# Patient Record
Sex: Female | Born: 1960 | Race: White | Hispanic: No | Marital: Single | Smoking: Never smoker
Health system: Southern US, Community
[De-identification: ages and names within clinical notes are randomized; demographics above are authoritative.]

## PROBLEM LIST (undated history)

## (undated) HISTORY — PX: ABDOMINAL HYSTERECTOMY: SHX81

---

## 2009-08-06 ENCOUNTER — Encounter: Admission: RE | Admit: 2009-08-06 | Discharge: 2009-08-06 | Payer: Self-pay | Admitting: Family Medicine

## 2009-08-06 ENCOUNTER — Ambulatory Visit: Payer: Self-pay | Admitting: Family Medicine

## 2009-08-06 DIAGNOSIS — R5383 Other fatigue: Secondary | ICD-10-CM

## 2009-08-06 DIAGNOSIS — R5381 Other malaise: Secondary | ICD-10-CM

## 2009-08-06 DIAGNOSIS — M25579 Pain in unspecified ankle and joints of unspecified foot: Secondary | ICD-10-CM | POA: Insufficient documentation

## 2009-08-06 DIAGNOSIS — I10 Essential (primary) hypertension: Secondary | ICD-10-CM | POA: Insufficient documentation

## 2009-08-06 DIAGNOSIS — E785 Hyperlipidemia, unspecified: Secondary | ICD-10-CM | POA: Insufficient documentation

## 2009-08-06 DIAGNOSIS — M25569 Pain in unspecified knee: Secondary | ICD-10-CM | POA: Insufficient documentation

## 2009-08-06 DIAGNOSIS — R002 Palpitations: Secondary | ICD-10-CM

## 2009-08-06 LAB — CONVERTED CEMR LAB
Alkaline Phosphatase: 58 units/L (ref 39–117)
CO2: 25 meq/L (ref 19–32)
Chloride: 105 meq/L (ref 96–112)
Creatinine, Ser: 0.65 mg/dL (ref 0.40–1.50)
Eosinophils Absolute: 0.1 10*3/uL (ref 0.0–0.7)
Eosinophils Relative: 1 % (ref 0–5)
Lymphocytes Relative: 33 % (ref 12–46)
MCHC: 34.2 g/dL (ref 30.0–36.0)
Potassium: 4.2 meq/L (ref 3.5–5.3)
RBC: 4.93 M/uL (ref 4.22–5.81)
RDW: 12.7 % (ref 11.5–15.5)
TSH: 1.139 microintl units/mL (ref 0.350–4.500)
Total Bilirubin: 0.5 mg/dL (ref 0.3–1.2)

## 2009-08-21 ENCOUNTER — Encounter: Payer: Self-pay | Admitting: Family Medicine

## 2010-04-14 NOTE — Miscellaneous (Signed)
Summary: MEDICAL RELEASE  MEDICAL RELEASE   Imported By: Shelbie Proctor 08/21/2009 18:29:30  _____________________________________________________________________  External Attachment:    Type:   Image     Comment:   External Document

## 2010-04-14 NOTE — Letter (Signed)
Summary: Out of Work  [facility]  [address]   [place], [place] [address]   Phone: [phone]  Fax: [phone]    Aug 06, 2009   Employee:  Christy Shaw    To Whom It May Concern:   For Medical reasons, please excuse the above named employee from work for the following dates:  Start:   08/06/2009  Return:   08/08/2009  If you need additional information, please feel free to contact our office.         Sincerely,    Hassan Rowan MD

## 2010-04-14 NOTE — Assessment & Plan Note (Signed)
Summary: DIZZINESS/KH   Vital Signs:  Patient Profile:   50 Years Old Female CC:      Rt ankle swelling moving up into legs x 1 week, SOB, Anxiety, lightheaded Height:     64 inches Weight:      181 pounds O2 Sat:      99 % O2 treatment:    Room Air Temp:     98.2 degrees F oral Pulse rate:   106 / minute Pulse rhythm:   regular Resp:     12 per minute BP sitting:   150 / 91  (right arm) Cuff size:   large  Vitals Entered By: Emilio Math (Aug 06, 2009 9:10 AM)                   Prior Medication List:  No prior medications documented  Current Allergies (reviewed today): ! PENICILLIN ! SULFAHistory of Present Illness Chief Complaint: Rt ankle swelling moving up into legs x 1 week, SOB, Anxiety, lightheaded History of Present Illness: Hx of bad ankle and feet. Hx of Mortons Neuroma. The R foot has been swollen and inflamed. Both lower legs are swollen.   Patient has had heart palpatations and his going through the changes. she has had increase fatigue.    Current Problems: FATIGUE, ACUTE (ICD-780.79) PALPITATIONS, RECURRENT (ICD-785.1) LEG, LOWER, PAIN (ICD-719.46) ANKLE, PAIN (ICD-719.47) EDEMA (ICD-782.3) HYPERTENSION (ICD-401.9) HYPERLIPIDEMIA (ICD-272.4)   Current Meds HYDROCHLOROTHIAZIDE 25 MG TABS (HYDROCHLOROTHIAZIDE)  SIMVASTATIN 20 MG TABS (SIMVASTATIN)  APATATE 25 MG/5ML LIQD (VITAMINS B1 B6 B12)   REVIEW OF SYSTEMS Constitutional Symptoms       Complains of weight gain and fatigue.     Denies fever, chills, night sweats, and weight loss.      Comments: 10 lbs in 1 week Eyes       Denies change in vision, eye pain, eye discharge, glasses, contact lenses, and eye surgery. Ear/Nose/Throat/Mouth       Denies hearing loss/aids, change in hearing, ear pain, ear discharge, dizziness, frequent runny nose, frequent nose bleeds, sinus problems, sore throat, hoarseness, and tooth pain or bleeding.  Respiratory       Complains of shortness of breath.       Denies dry cough, productive cough, wheezing, asthma, bronchitis, and emphysema/COPD.  Cardiovascular       Complains of tires easily with exhertion.      Denies murmurs and chest pain.      Comments: palpatations   Gastrointestinal       Denies stomach pain, nausea/vomiting, diarrhea, constipation, blood in bowel movements, and indigestion. Genitourniary       Denies painful urination, kidney stones, and loss of urinary control. Neurological       Denies paralysis, seizures, and fainting/blackouts. Musculoskeletal       Complains of muscle pain, joint pain, and swelling.      Denies joint stiffness, decreased range of motion, redness, muscle weakness, and gout.  Skin       Denies bruising, unusual mles/lumps or sores, and hair/skin or nail changes.  Psych       Complains of anxiety/stress.      Denies mood changes, temper/anger issues, speech problems, depression, and sleep problems.  Past History:  Family History: Last updated: 08/06/2009 Mother, D, Cerebral hemorrage father, D, Prostate CA Brother died of heart conditions in his 4s.  Social History: Last updated: 08/06/2009 Non smoker ETOH-Yes  No [employer] Work  Past Medical History: Hyperlipidemia Hypertension  Past Surgical History:  Hysterectomy  Family History: Reviewed history and no changes required. Mother, D, Cerebral hemorrage father, D, Prostate CA Brother died of heart conditions in his 64s.  Social History: Reviewed history and no changes required. Non smoker ETOH-Yes  No [employer] Work Physical Exam General appearance: well developed, well nourished,mild distress Head: normocephalic, atraumatic Neck: neck supple,  trachea midline, no masses Chest/Lungs: no rales, wheezes, or rhonchi bilateral, breath sounds equal without effort Heart: regular rate and  rhythm, no murmur Abdomen: soft, non-tender without obvious organomegaly Extremities: mild swelling   negative homan Neurological:  grossly intact and non-focal Skin: no obvious rashes or lesions MSE: oriented to time, place, and person Assessment Problems:   New Problems: FATIGUE, ACUTE (ICD-780.79) PALPITATIONS, RECURRENT (ICD-785.1) LEG, LOWER, PAIN (ICD-719.46) ANKLE, PAIN (ICD-719.47) EDEMA (ICD-782.3) HYPERTENSION (ICD-401.9) HYPERLIPIDEMIA (ICD-272.4)  edema of leg  leg  pain   fstigue    stress  Patient Education: Patient and/or caregiver instructed in the following: rest fluids and Tylenol.  Plan New Orders: New Patient Level IV [99204] EKG w/ Interpretation [93000] T-CBC w/Diff [[id number]] T-TSH [[id number]] T-Comprehensive Metabolic Panel [[id number]] Lower Extremity Ultrasound with Doppler [Sono LE w/Doppler] Planning Comments:   will recommend she keep her appointment w/her PCP tommorrow. They can get her records faxed  Follow Up: Follow up in 2-3 days if no improvement  The patient and/or caregiver has been counseled thoroughly with regard to medications prescribed including dosage, schedule, interactions, rationale for use, and possible side effects and they verbalize understanding.  Diagnoses and expected course of recovery discussed and will return if not improved as expected or if the condition worsens. Patient and/or caregiver verbalized understanding.   Patient Instructions: 1)  Please schedule an appointment with your primary doctor in :AM as scheduled 2)  Willl hold off on any new scrips 3)  Recommended remaining out of work for 2 days maty return on 08/08/2009.

## 2014-03-17 ENCOUNTER — Emergency Department (HOSPITAL_BASED_OUTPATIENT_CLINIC_OR_DEPARTMENT_OTHER): Payer: BC Managed Care – PPO

## 2014-03-17 ENCOUNTER — Encounter (HOSPITAL_BASED_OUTPATIENT_CLINIC_OR_DEPARTMENT_OTHER): Payer: Self-pay | Admitting: *Deleted

## 2014-03-17 ENCOUNTER — Emergency Department (HOSPITAL_BASED_OUTPATIENT_CLINIC_OR_DEPARTMENT_OTHER)
Admission: EM | Admit: 2014-03-17 | Discharge: 2014-03-17 | Disposition: A | Discharge status: Home / Self Care | Payer: BC Managed Care – PPO | Attending: Emergency Medicine | Admitting: Emergency Medicine

## 2014-03-17 DIAGNOSIS — S59912A Unspecified injury of left forearm, initial encounter: Secondary | ICD-10-CM

## 2014-03-17 DIAGNOSIS — Z880 Allergy status to penicillin: Secondary | ICD-10-CM | POA: Diagnosis not present

## 2014-03-17 DIAGNOSIS — Y998 Other external cause status: Secondary | ICD-10-CM | POA: Diagnosis not present

## 2014-03-17 DIAGNOSIS — S8011XA Contusion of right lower leg, initial encounter: Secondary | ICD-10-CM | POA: Diagnosis not present

## 2014-03-17 DIAGNOSIS — Y9289 Other specified places as the place of occurrence of the external cause: Secondary | ICD-10-CM | POA: Diagnosis not present

## 2014-03-17 DIAGNOSIS — Y9389 Activity, other specified: Secondary | ICD-10-CM | POA: Diagnosis not present

## 2014-03-17 DIAGNOSIS — Z79899 Other long term (current) drug therapy: Secondary | ICD-10-CM | POA: Diagnosis not present

## 2014-03-17 DIAGNOSIS — S5012XA Contusion of left forearm, initial encounter: Secondary | ICD-10-CM | POA: Insufficient documentation

## 2014-03-17 DIAGNOSIS — S4991XA Unspecified injury of right shoulder and upper arm, initial encounter: Secondary | ICD-10-CM | POA: Diagnosis present

## 2014-03-17 DIAGNOSIS — W19XXXA Unspecified fall, initial encounter: Secondary | ICD-10-CM

## 2014-03-17 DIAGNOSIS — W01198A Fall on same level from slipping, tripping and stumbling with subsequent striking against other object, initial encounter: Secondary | ICD-10-CM | POA: Insufficient documentation

## 2014-03-17 DIAGNOSIS — M79661 Pain in right lower leg: Secondary | ICD-10-CM

## 2014-03-17 MED ORDER — IBUPROFEN 800 MG PO TABS
800.0000 mg | ORAL_TABLET | Freq: Once | ORAL | Status: AC
  Administered 2014-03-17: 800 mg via ORAL
  Filled 2014-03-17: qty 1

## 2014-03-17 MED ORDER — HYDROCODONE-ACETAMINOPHEN 5-325 MG PO TABS: 2.0000 | ORAL_TABLET | Freq: Once | ORAL | Status: DC

## 2014-03-17 MED ORDER — HYDROCODONE-ACETAMINOPHEN 5-325 MG PO TABS: 1.0000 | ORAL_TABLET | Freq: Four times a day (QID) | ORAL | Status: AC | PRN

## 2014-03-17 NOTE — ED Provider Notes (Signed)
CSN: [id number]     Arrival date & time 03/17/14  4098 History   First MD Initiated Contact with Patient 03/17/14 0715     Chief Complaint  Patient presents with  . Fall     (Consider location/radiation/quality/duration/timing/severity/associated sxs/prior Treatment) The history is provided by the patient.  Christy Shaw is a 54 y.o. female here with fall. She was painting last night around 9 PM. She was standing on the kitchen counter and accidentally fell. She did fall backwards and hit the back of her head as well as the right shoulder and left forearm and left leg. She woke up this morning with severe pain. Denies any headache or nausea or vomiting. Complains of right shoulder pain left arm bruise and right calf bruise. Denies any neck pain.    History reviewed. No pertinent past medical history. Past Surgical History  Procedure Laterality Date  . Abdominal hysterectomy     No family history on file. History  Substance Use Topics  . Smoking status: Never Smoker   . Smokeless tobacco: Not on file  . Alcohol Use: No   OB History    No data available     Review of Systems  Musculoskeletal:       R leg, R shoulder, L forearm pain   All other systems reviewed and are negative.     Allergies  Penicillins and Sulfonamide derivatives  Home Medications   Prior to Admission medications   Medication Sig Start Date End Date Taking? Authorizing Provider  hydrochlorothiazide (HYDRODIURIL) 12.5 MG tablet Take 12.5 mg by mouth daily.   Yes Historical Provider, MD  simvastatin (ZOCOR) 10 MG tablet Take 10 mg by mouth daily.   Yes Historical Provider, MD   BP 143/69 mmHg  Pulse 71  Temp(Src) 98.3 F (36.8 C) (Oral)  Resp 20  Ht  (1.6 m)  Wt 155 lb (70.308 kg)  BMI 27.46 kg/m2  SpO2 100% Physical Exam  Constitutional: She is oriented to person, place, and time.  Slightly uncomfortable   HENT:  Head: Normocephalic and atraumatic.  Mouth/Throat: Oropharynx is clear and  moist.  No obvious scalp hematoma   Eyes: Conjunctivae are normal. Pupils are equal, round, and reactive to light.  Neck:  No midline tenderness, nl ROM neck   Cardiovascular: Normal rate, regular rhythm and normal heart sounds.   Pulmonary/Chest: Effort normal and breath sounds normal. No respiratory distress. She has no wheezes. She has no rales. She exhibits no tenderness.  Abdominal: Soft. Bowel sounds are normal. She exhibits no distension. There is no tenderness. There is no rebound and no guarding.  Musculoskeletal:  L forearm with bruise, no obvious bony tenderness. R calf with bruise with no obvious bony tenderness. Neurovascular intact. R shoulder dec ROM   Neurological: She is alert and oriented to person, place, and time. No cranial nerve deficit. Coordination normal.  Skin: Skin is warm and dry.  Psychiatric: She has a normal mood and affect. Her behavior is normal. Judgment and thought content normal.  Nursing note and vitals reviewed.   ED Course  Procedures (including critical care time) Labs Review Labs Reviewed - No data to display  Imaging Review Dg Shoulder Right  03/17/2014   CLINICAL DATA:  Fall last night.  Right superior scapular pain.  EXAM: RIGHT SHOULDER - 2+ VIEW  COMPARISON:  None.  FINDINGS: Visualized portion of the right hemithorax is normal. No acute fracture or dislocation.  IMPRESSION: Normal right shoulder.   Electronically Signed  By: Jeronimo Greaves M.D.   On: 03/17/2014 08:23   Dg Forearm Left  03/17/2014   CLINICAL DATA:  Status post fall from kitchen counter last night with left forearm pain  EXAM: LEFT FOREARM - 2 VIEW  COMPARISON:  None.  FINDINGS: There is no evidence of fracture or dislocation. Soft tissues are unremarkable.  IMPRESSION: No acute fracture dislocation.   Electronically Signed   By: Sherian Rein M.D.   On: 03/17/2014 08:26   Dg Tibia/fibula Right  03/17/2014   CLINICAL DATA:  Fall from kitchen count are last night. Abrasion in  medial right lower leg with pain.  EXAM: RIGHT TIBIA AND FIBULA - 2 VIEW  COMPARISON:  None.  FINDINGS: Small Achilles spur. No acute fracture or dislocation. No significant soft tissue swelling. No knee joint effusion.  IMPRESSION: No acute osseous abnormality.   Electronically Signed   By: Jeronimo Greaves M.D.   On: 03/17/2014 08:27     EKG Interpretation None      MDM   Final diagnoses:  Fall    Christy Shaw is a 54 y.o. female here with mechanical fall. Likely muscle injury. Will get xray to r/o fractures. No need for CT head as she has nl neuro exam, not on blood thinners.   8:38 AM Xray showed no fracture. Will dc home with prn vicodin.    Richardean Canal, MD 03/17/14 [phone]

## 2014-03-17 NOTE — Discharge Instructions (Signed)
Take motrin for pain.   Take vicodin for severe pain. Do NOT drive with it.   Rest at home for 2 days.   Follow up with your doctor.   Return to ER if you have severe pain, unable to walk.

## 2014-03-17 NOTE — ED Notes (Signed)
Fell last night appx 9pm, standing on a counter and fell. Hit her head, right knee, right shoulder. Walking is painful d/t right leg. Stiff neck, but denies N/V and headache.

## 2015-08-09 IMAGING — CR DG FOREARM 2V*L*
2 series · 2 of 2 positions shown · non-contrast
Comparison: None.

CLINICAL DATA: Status post fall from kitchen counter last night
with left forearm pain

EXAM:
LEFT FOREARM - 2 VIEW

[x forearm ap left]
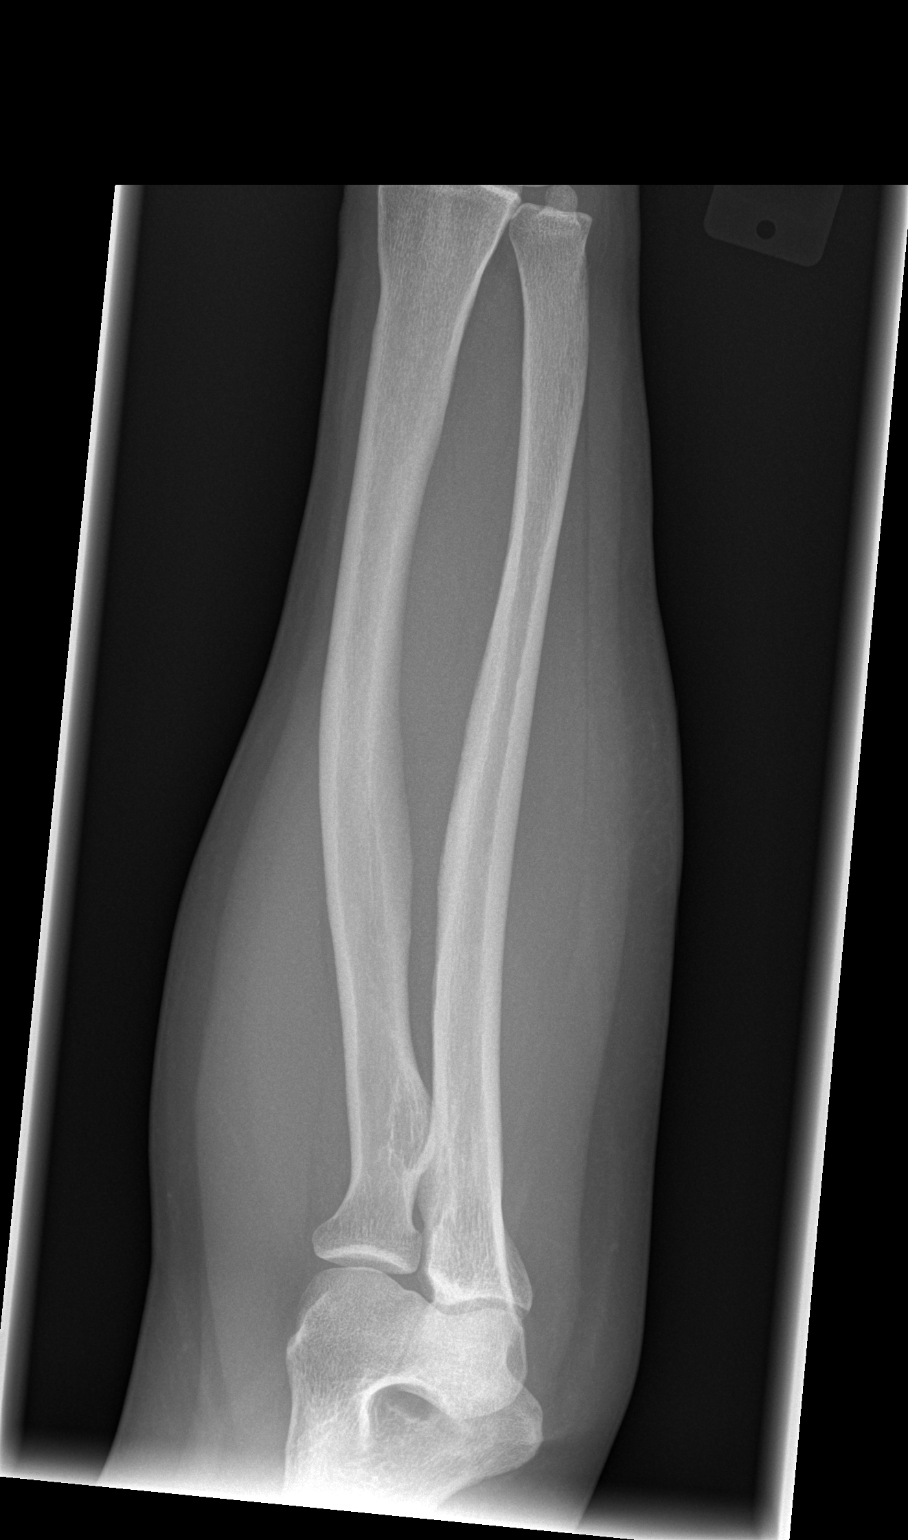

[x forearm lat left]
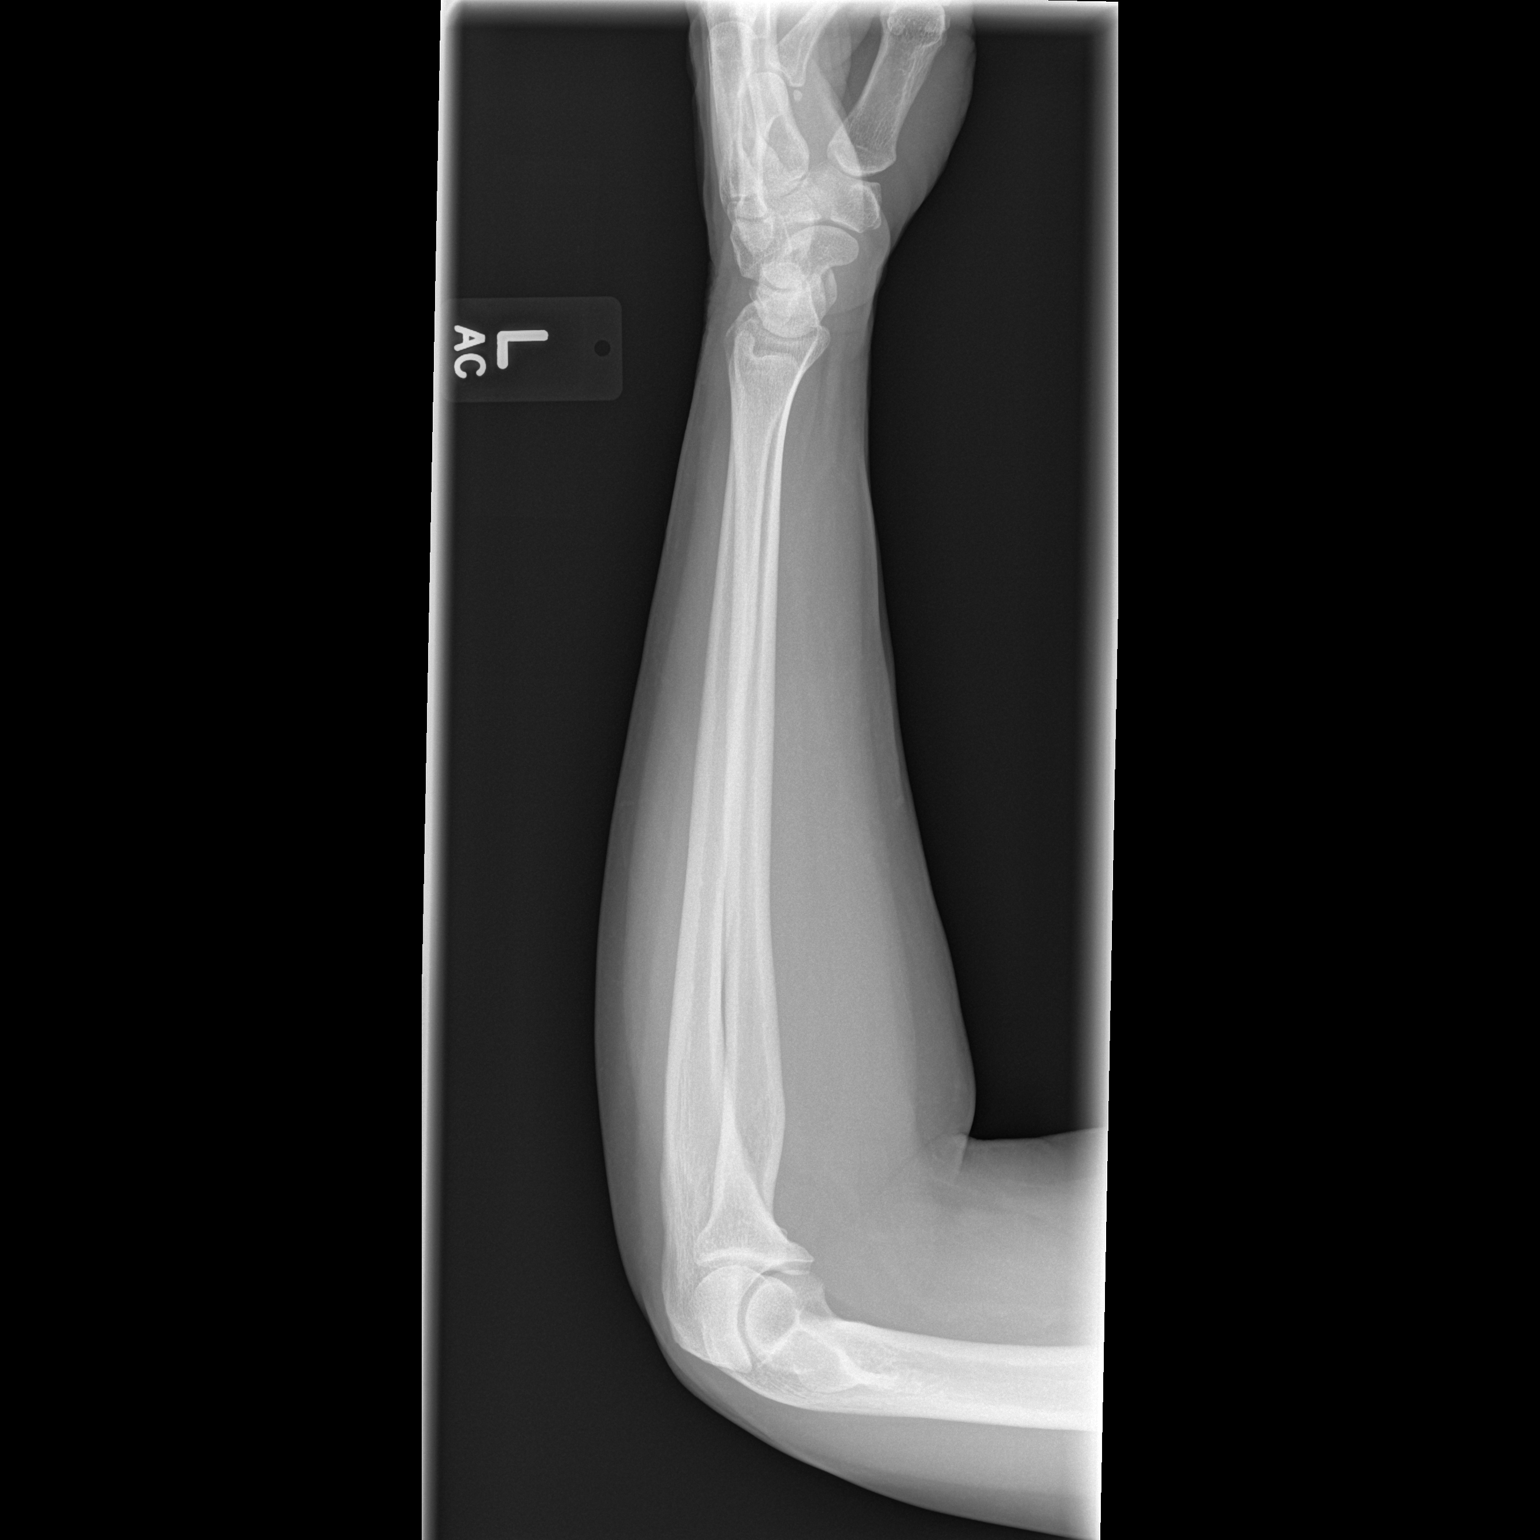

[2 of 2 positions shown; findings below may reference images not displayed]

FINDINGS: There is no evidence of fracture or dislocation. Soft tissues are
unremarkable.
IMPRESSION: No acute fracture dislocation.
